# Patient Record
Sex: Male | Born: 1985 | Race: White | Hispanic: No | Marital: Married | Smoking: Never smoker
Health system: Southern US, Community
[De-identification: ages and names within clinical notes are randomized; demographics above are authoritative.]

## PROBLEM LIST (undated history)

## (undated) DIAGNOSIS — E079 Disorder of thyroid, unspecified: Secondary | ICD-10-CM

## (undated) HISTORY — PX: TONSILLECTOMY: SUR1361

## (undated) HISTORY — DX: Disorder of thyroid, unspecified: E07.9

---

## 2015-10-21 DIAGNOSIS — R51 Headache: Secondary | ICD-10-CM | POA: Diagnosis not present

## 2015-11-04 DIAGNOSIS — Z0000 Encounter for general adult medical examination without abnormal findings: Secondary | ICD-10-CM | POA: Diagnosis not present

## 2015-11-05 DIAGNOSIS — G44219 Episodic tension-type headache, not intractable: Secondary | ICD-10-CM | POA: Diagnosis not present

## 2015-11-05 DIAGNOSIS — Z049 Encounter for examination and observation for unspecified reason: Secondary | ICD-10-CM | POA: Diagnosis not present

## 2015-11-19 DIAGNOSIS — R1013 Epigastric pain: Secondary | ICD-10-CM | POA: Diagnosis not present

## 2015-11-19 DIAGNOSIS — R110 Nausea: Secondary | ICD-10-CM | POA: Diagnosis not present

## 2015-12-31 DIAGNOSIS — R1013 Epigastric pain: Secondary | ICD-10-CM | POA: Diagnosis not present

## 2016-01-01 DIAGNOSIS — G44219 Episodic tension-type headache, not intractable: Secondary | ICD-10-CM | POA: Diagnosis not present

## 2016-01-29 DIAGNOSIS — R1013 Epigastric pain: Secondary | ICD-10-CM | POA: Diagnosis not present

## 2016-03-18 DIAGNOSIS — L255 Unspecified contact dermatitis due to plants, except food: Secondary | ICD-10-CM | POA: Diagnosis not present

## 2016-03-18 DIAGNOSIS — H1033 Unspecified acute conjunctivitis, bilateral: Secondary | ICD-10-CM | POA: Diagnosis not present

## 2016-04-07 DIAGNOSIS — K2970 Gastritis, unspecified, without bleeding: Secondary | ICD-10-CM | POA: Diagnosis not present

## 2016-04-07 DIAGNOSIS — E039 Hypothyroidism, unspecified: Secondary | ICD-10-CM | POA: Diagnosis not present

## 2016-04-07 DIAGNOSIS — K2900 Acute gastritis without bleeding: Secondary | ICD-10-CM | POA: Diagnosis not present

## 2016-04-07 DIAGNOSIS — Z79899 Other long term (current) drug therapy: Secondary | ICD-10-CM | POA: Diagnosis not present

## 2016-04-07 DIAGNOSIS — R1013 Epigastric pain: Secondary | ICD-10-CM | POA: Diagnosis not present

## 2016-07-19 DIAGNOSIS — R251 Tremor, unspecified: Secondary | ICD-10-CM | POA: Diagnosis not present

## 2016-07-19 DIAGNOSIS — N509 Disorder of male genital organs, unspecified: Secondary | ICD-10-CM | POA: Diagnosis not present

## 2016-07-19 DIAGNOSIS — E039 Hypothyroidism, unspecified: Secondary | ICD-10-CM | POA: Diagnosis not present

## 2016-07-26 DIAGNOSIS — J029 Acute pharyngitis, unspecified: Secondary | ICD-10-CM | POA: Diagnosis not present

## 2016-07-26 NOTE — Progress Notes (Signed)
Subjective:   Trevor Castro was seen in consultation in the movement disorder clinic at the request of Konrad Felix, MD.  The evaluation is for tremor.  Tremor started approximately a few weeks ago and involves the bilateral hands.  Pt states "I never had steady hands but my wife noticed it."  Tremor is most noticeable when his arms are outstretched and "I worry about things so I have noticed it."   He noted when his family held their hands out they have a little tremor as well  Affected by caffeine:  Doesn't drink any Affected by alcohol:  Doesn't drink any Affected by stress:  Yes.   Affected by fatigue:  No. Spills soup if on spoon:  No. Spills glass of liquid if full:  No. Affects ADL's (tying shoes, brushing teeth, etc):  No.  Current/Previously tried tremor medications: n/a  Current medications that may exacerbate tremor:  On synthroid (dosage has been stable for a long time)  Outside reports reviewed: historical medical records, lab reports and office notes.  No Known Allergies  Outpatient Encounter Prescriptions as of 07/27/2016  Medication Sig  . levothyroxine (SYNTHROID, LEVOTHROID) 50 MCG tablet Take 50 mcg by mouth daily.  Marland Kitchen triamcinolone (NASACORT ALLERGY 24HR) 55 MCG/ACT AERO nasal inhaler Place 2 sprays into the nose daily.   No facility-administered encounter medications on file as of 07/27/2016.     Past Medical History:  Diagnosis Date  . Thyroid disease     Past Surgical History:  Procedure Laterality Date  . TONSILLECTOMY      Social History   Social History  . Marital status: Unknown    Spouse name: N/A  . Number of children: N/A  . Years of education: N/A   Occupational History  . [employer]    Social History Main Topics  . Smoking status: Never Smoker  . Smokeless tobacco: Never Used  . Alcohol use No  . Drug use: No  . Sexual activity: Not on file   Other Topics Concern  . Not on file   Social History Narrative    . No narrative on file    Family Status  Relation Status  . Mother Alive  . Father Alive  . Sister Alive  . Brother Alive  . Sister Alive  . Daughter Alive    Review of Systems A complete 10 system ROS was obtained and was negative apart from what is mentioned.   Objective:   VITALS:   Vitals:   07/27/16 1346  BP: 120/72  Pulse: 96  Weight: 146 lb (66.2 kg)  Height: 5\' 6"  (1.676 m)   Gen:  Appears stated age and in NAD.  Is anxious.   HEENT:  Normocephalic, atraumatic. The mucous membranes are moist. The superficial temporal arteries are without ropiness or tenderness. Cardiovascular: Regular rate and rhythm. Lungs: Clear to auscultation bilaterally. Neck: There are no carotid bruits noted bilaterally.  NEUROLOGICAL:  Orientation:  The patient is alert and oriented x 3.  Recent and remote memory are intact.  Attention span and concentration are normal.  Able to name objects and repeat without trouble.  Fund of knowledge is appropriate Cranial nerves: There is good facial symmetry. The pupils are equal round and reactive to light bilaterally. Fundoscopic exam reveals clear disc margins bilaterally. Extraocular muscles are intact and visual fields are full to confrontational testing. Speech is fluent and clear. Soft palate rises symmetrically and there is no tongue deviation. Hearing is intact to  conversational tone. Tone: Tone is good throughout. Sensation: Sensation is intact to light touch and pinprick throughout (facial, trunk, extremities). Vibration is intact at the bilateral big toe. There is no extinction with double simultaneous stimulation. There is no sensory dermatomal level identified. Coordination:  The patient has no dysdiadichokinesia or dysmetria. Motor: Strength is 5/5 in the bilateral upper and lower extremities.  Shoulder shrug is equal bilaterally.  There is no pronator drift.  There are no fasciculations noted. DTR's: Deep tendon reflexes are 2+/4 at the  bilateral biceps, triceps, brachioradialis, 3/4 at the bilateral patella and 2/4 at the bilateral achilles.  Plantar responses are downgoing bilaterally. Gait and Station: The patient is able to ambulate without difficulty. The patient is able to heel toe walk without any difficulty. The patient is able to ambulate in a tandem fashion. The patient is able to stand in the Romberg position.   MOVEMENT EXAM: Tremor:  There is minimal tremor of the outstretched hands that is noted equally with intention.  No rest tremor.  Has minimal tremor with archimedes spirals.  Tremor is just slightly evident when asked to pour water from one glass to another.   LABS:  The patient had lab work on 07/19/16 his white blood cells were 9.5, hemoglobin 14.6, hematocrit 42.8 and platelets 192.  Sodium was 138, potassium 4.6, chloride 97, CO2 21, AST 19, ALT 16, alkaline phosphatase 76, TSH 1.120.     Assessment/Plan:   1.  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  I explained to him nature and pathophysiology and discussed with him benign nature.  I did not recommend treatment for the tremor but do think that treatment for the anxiety could help significantly.  I did tell him that if he had a work presentation, etc then prn tx for tremor could be of value but otherwise wouldn't recommend tx.  It really isn't bothersome to the patient, he was just worried about it.  He will let me know if new neuro sx's arise.  Much greater than 50% of this visit was spent in counseling and coordinating care.  Total face to face time:  45 min   CC:  Konrad FelixBrad Thomas, MD

## 2016-07-27 ENCOUNTER — Encounter: Payer: Self-pay | Admitting: Neurology

## 2016-07-27 ENCOUNTER — Ambulatory Visit (INDEPENDENT_AMBULATORY_CARE_PROVIDER_SITE_OTHER): Payer: BLUE CROSS/BLUE SHIELD | Admitting: Neurology

## 2016-07-27 VITALS — BP 120/72 | HR 96 | Ht 66.0 in | Wt 146.0 lb

## 2016-07-27 DIAGNOSIS — R251 Tremor, unspecified: Secondary | ICD-10-CM | POA: Diagnosis not present

## 2016-07-27 DIAGNOSIS — F411 Generalized anxiety disorder: Secondary | ICD-10-CM | POA: Diagnosis not present

## 2016-07-27 DIAGNOSIS — H5203 Hypermetropia, bilateral: Secondary | ICD-10-CM | POA: Diagnosis not present

## 2016-08-18 DIAGNOSIS — N419 Inflammatory disease of prostate, unspecified: Secondary | ICD-10-CM | POA: Diagnosis not present

## 2016-08-18 DIAGNOSIS — N3000 Acute cystitis without hematuria: Secondary | ICD-10-CM | POA: Diagnosis not present

## 2016-08-29 DIAGNOSIS — M940 Chondrocostal junction syndrome [Tietze]: Secondary | ICD-10-CM | POA: Diagnosis not present

## 2016-08-29 DIAGNOSIS — R079 Chest pain, unspecified: Secondary | ICD-10-CM | POA: Diagnosis not present

## 2016-08-29 DIAGNOSIS — R072 Precordial pain: Secondary | ICD-10-CM | POA: Diagnosis not present

## 2016-08-30 DIAGNOSIS — N410 Acute prostatitis: Secondary | ICD-10-CM | POA: Diagnosis not present

## 2016-09-22 DIAGNOSIS — N410 Acute prostatitis: Secondary | ICD-10-CM | POA: Diagnosis not present

## 2016-11-16 DIAGNOSIS — Z1211 Encounter for screening for malignant neoplasm of colon: Secondary | ICD-10-CM | POA: Diagnosis not present

## 2016-11-16 DIAGNOSIS — Z86010 Personal history of colonic polyps: Secondary | ICD-10-CM | POA: Diagnosis not present

## 2016-11-16 DIAGNOSIS — Z01818 Encounter for other preprocedural examination: Secondary | ICD-10-CM | POA: Diagnosis not present

## 2016-11-18 DIAGNOSIS — J0100 Acute maxillary sinusitis, unspecified: Secondary | ICD-10-CM | POA: Diagnosis not present

## 2016-12-15 DIAGNOSIS — Z79899 Other long term (current) drug therapy: Secondary | ICD-10-CM | POA: Diagnosis not present

## 2016-12-15 DIAGNOSIS — K601 Chronic anal fissure: Secondary | ICD-10-CM | POA: Diagnosis not present

## 2016-12-15 DIAGNOSIS — Z86010 Personal history of colonic polyps: Secondary | ICD-10-CM | POA: Diagnosis not present

## 2016-12-15 DIAGNOSIS — Z1211 Encounter for screening for malignant neoplasm of colon: Secondary | ICD-10-CM | POA: Diagnosis not present

## 2016-12-15 DIAGNOSIS — K644 Residual hemorrhoidal skin tags: Secondary | ICD-10-CM | POA: Diagnosis not present

## 2016-12-15 DIAGNOSIS — E039 Hypothyroidism, unspecified: Secondary | ICD-10-CM | POA: Diagnosis not present

## 2017-01-03 DIAGNOSIS — E039 Hypothyroidism, unspecified: Secondary | ICD-10-CM | POA: Diagnosis not present

## 2017-01-03 DIAGNOSIS — R102 Pelvic and perineal pain: Secondary | ICD-10-CM | POA: Diagnosis not present

## 2017-01-11 DIAGNOSIS — L255 Unspecified contact dermatitis due to plants, except food: Secondary | ICD-10-CM | POA: Diagnosis not present

## 2017-02-08 DIAGNOSIS — E039 Hypothyroidism, unspecified: Secondary | ICD-10-CM | POA: Diagnosis not present

## 2017-02-08 DIAGNOSIS — Z79899 Other long term (current) drug therapy: Secondary | ICD-10-CM | POA: Diagnosis not present

## 2017-02-08 DIAGNOSIS — Z0000 Encounter for general adult medical examination without abnormal findings: Secondary | ICD-10-CM | POA: Diagnosis not present

## 2017-02-08 DIAGNOSIS — Z1389 Encounter for screening for other disorder: Secondary | ICD-10-CM | POA: Diagnosis not present

## 2017-03-18 DIAGNOSIS — R3915 Urgency of urination: Secondary | ICD-10-CM | POA: Diagnosis not present

## 2017-03-18 DIAGNOSIS — N411 Chronic prostatitis: Secondary | ICD-10-CM | POA: Diagnosis not present

## 2017-03-18 DIAGNOSIS — R350 Frequency of micturition: Secondary | ICD-10-CM | POA: Diagnosis not present

## 2017-03-24 DIAGNOSIS — K529 Noninfective gastroenteritis and colitis, unspecified: Secondary | ICD-10-CM | POA: Diagnosis not present

## 2017-05-17 DIAGNOSIS — N411 Chronic prostatitis: Secondary | ICD-10-CM | POA: Diagnosis not present

## 2017-06-22 DIAGNOSIS — J069 Acute upper respiratory infection, unspecified: Secondary | ICD-10-CM | POA: Diagnosis not present

## 2017-07-14 ENCOUNTER — Other Ambulatory Visit: Payer: Self-pay | Admitting: Urology

## 2017-07-14 DIAGNOSIS — R350 Frequency of micturition: Secondary | ICD-10-CM | POA: Diagnosis not present

## 2017-07-14 DIAGNOSIS — R1033 Periumbilical pain: Secondary | ICD-10-CM | POA: Diagnosis not present

## 2017-07-20 ENCOUNTER — Ambulatory Visit (HOSPITAL_COMMUNITY)
Admission: RE | Admit: 2017-07-20 | Discharge: 2017-07-20 | Disposition: A | Discharge status: Home / Self Care | Payer: BLUE CROSS/BLUE SHIELD | Source: Ambulatory Visit | Attending: Urology | Admitting: Urology

## 2017-07-20 ENCOUNTER — Other Ambulatory Visit: Payer: Self-pay | Admitting: Urology

## 2017-07-20 DIAGNOSIS — R39198 Other difficulties with micturition: Secondary | ICD-10-CM | POA: Insufficient documentation

## 2017-07-20 DIAGNOSIS — R1033 Periumbilical pain: Secondary | ICD-10-CM | POA: Diagnosis not present

## 2017-07-25 DIAGNOSIS — J029 Acute pharyngitis, unspecified: Secondary | ICD-10-CM | POA: Diagnosis not present

## 2017-10-17 DIAGNOSIS — E039 Hypothyroidism, unspecified: Secondary | ICD-10-CM | POA: Diagnosis not present

## 2017-11-11 DIAGNOSIS — K529 Noninfective gastroenteritis and colitis, unspecified: Secondary | ICD-10-CM | POA: Diagnosis not present

## 2017-12-24 DIAGNOSIS — J029 Acute pharyngitis, unspecified: Secondary | ICD-10-CM | POA: Diagnosis not present

## 2017-12-24 DIAGNOSIS — J069 Acute upper respiratory infection, unspecified: Secondary | ICD-10-CM | POA: Diagnosis not present

## 2017-12-24 DIAGNOSIS — Z7251 High risk heterosexual behavior: Secondary | ICD-10-CM | POA: Diagnosis not present

## 2018-02-27 IMAGING — US US RENAL
1 series · 14 of 25 positions shown · non-contrast
Comparison: None

CLINICAL DATA: Periumbilical abdominal pain

EXAM:
RENAL / URINARY TRACT ULTRASOUND COMPLETE

[Series 1: us renal · 0.23mm/px · 14 of 59 slices shown]
[im 1/59]
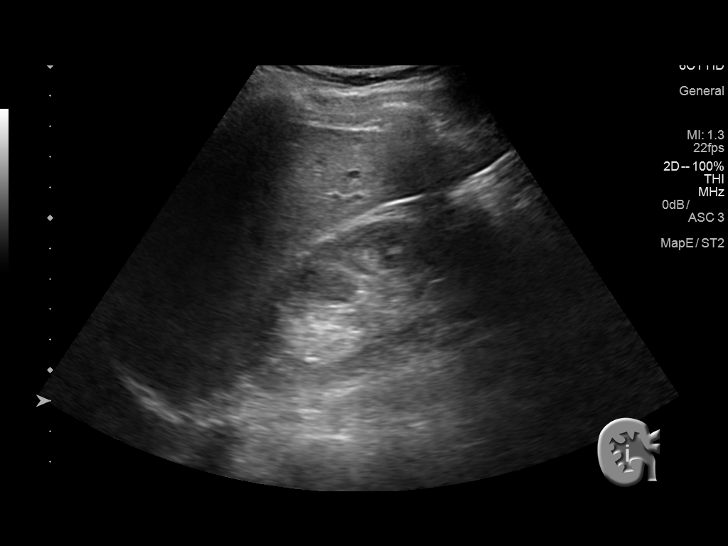
[im 5/59]
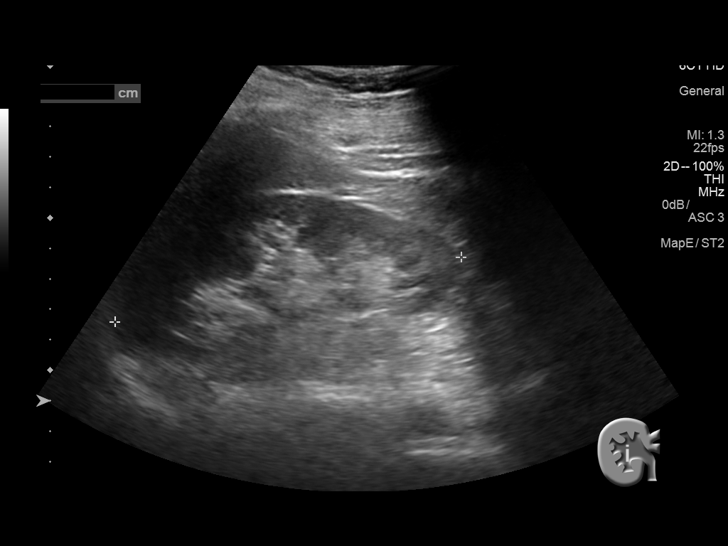
[im 10/59]
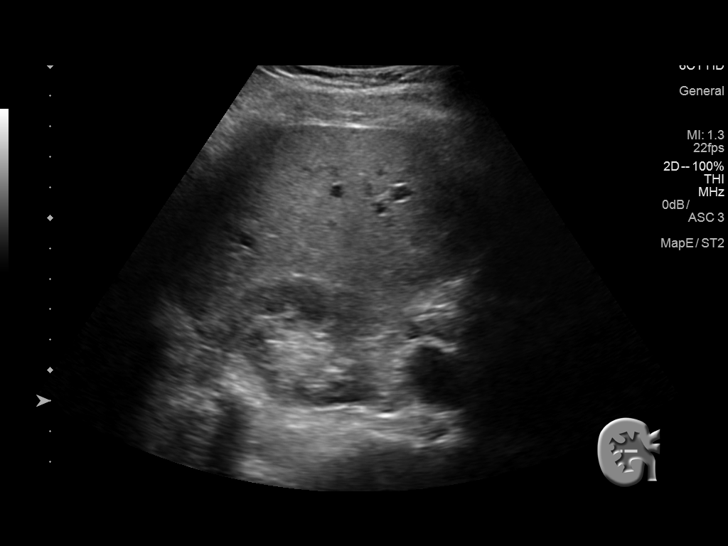
[im 15/59]
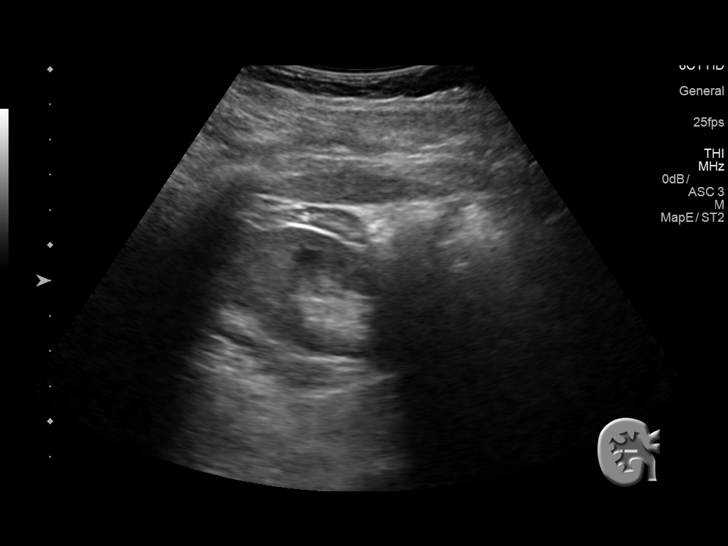
[im 20/59]
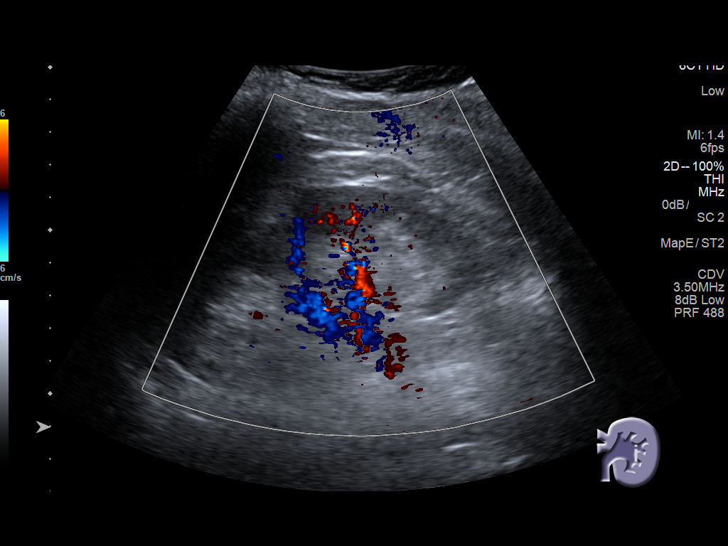
[im 22/59]
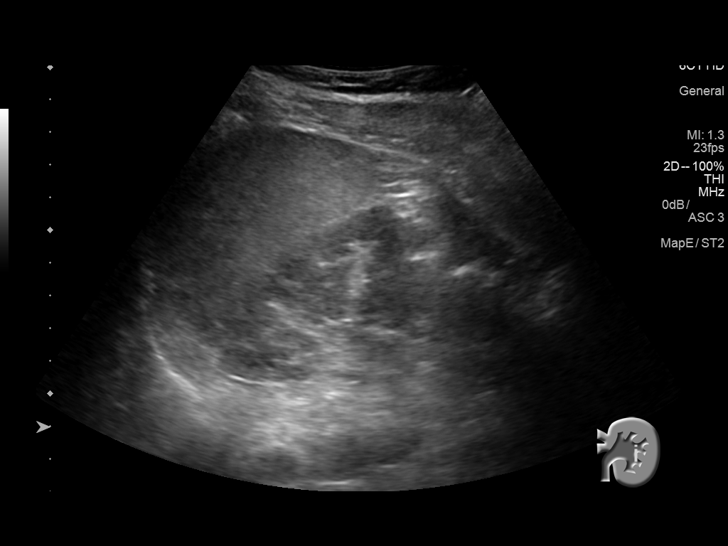
[im 27/59]
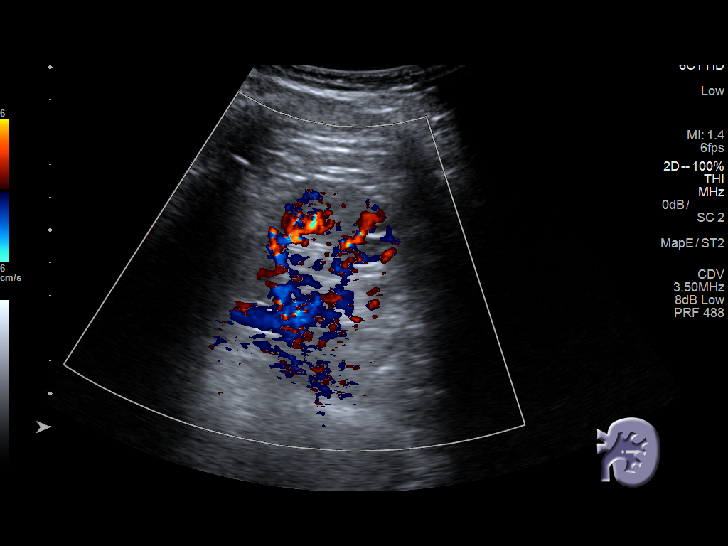
[im 32/59]
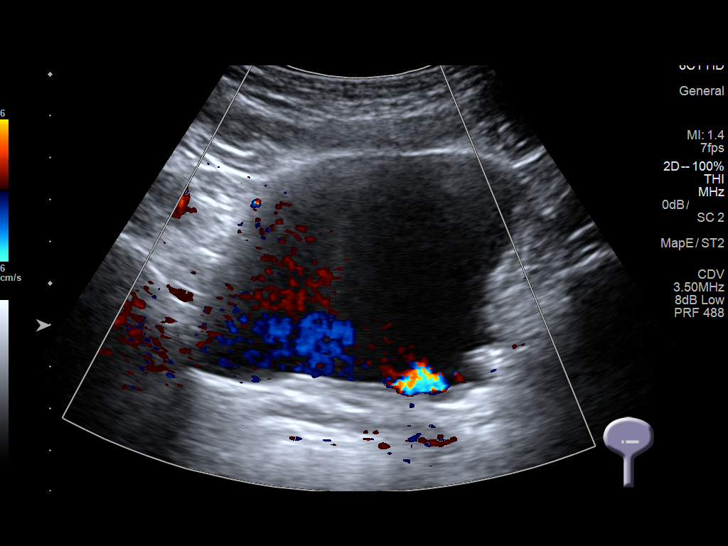
[im 37/59]
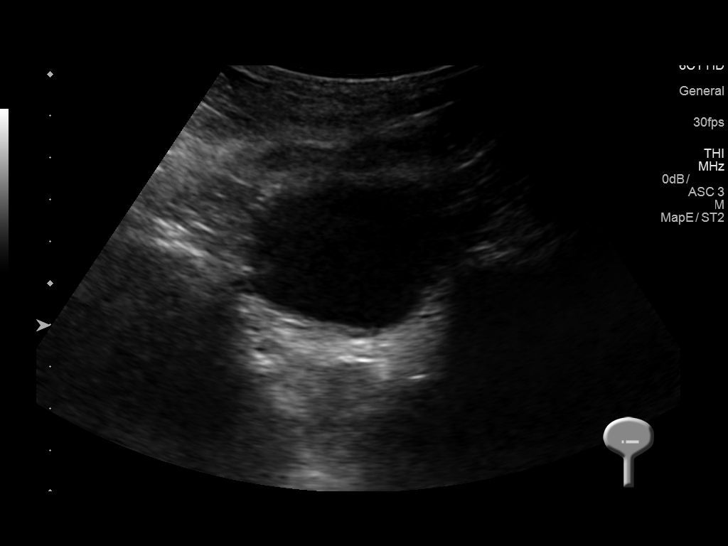
[im 39/59]
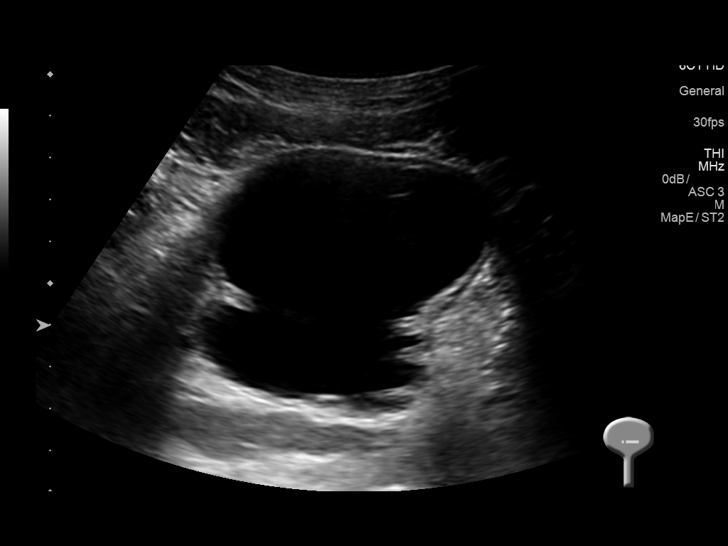
[im 44/59]
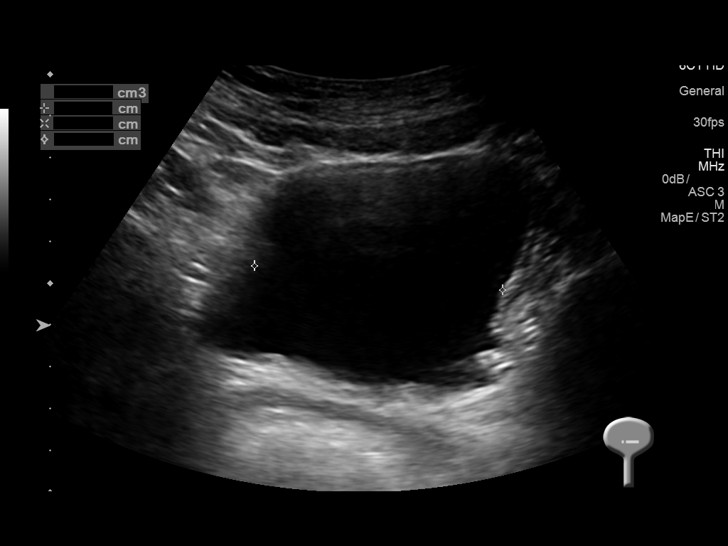
[im 49/59]
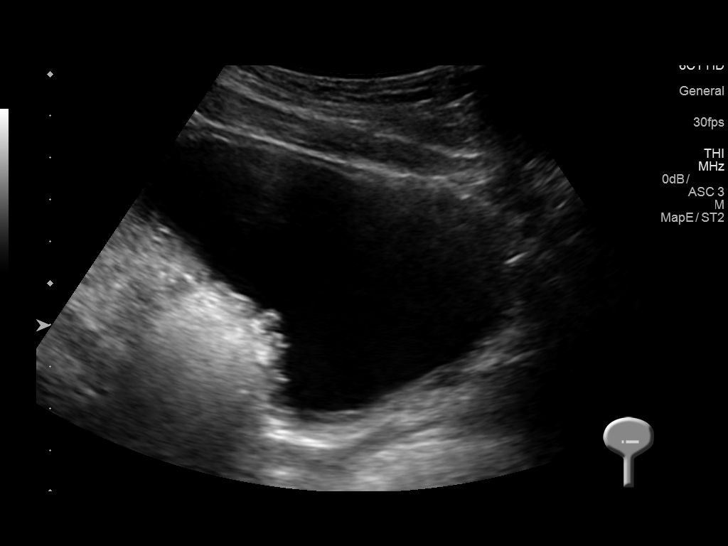
[im 54/59]
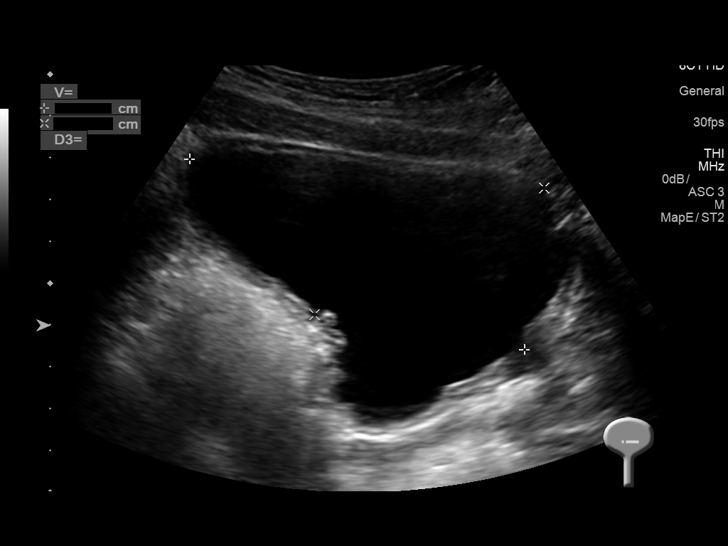
[im 59/59]
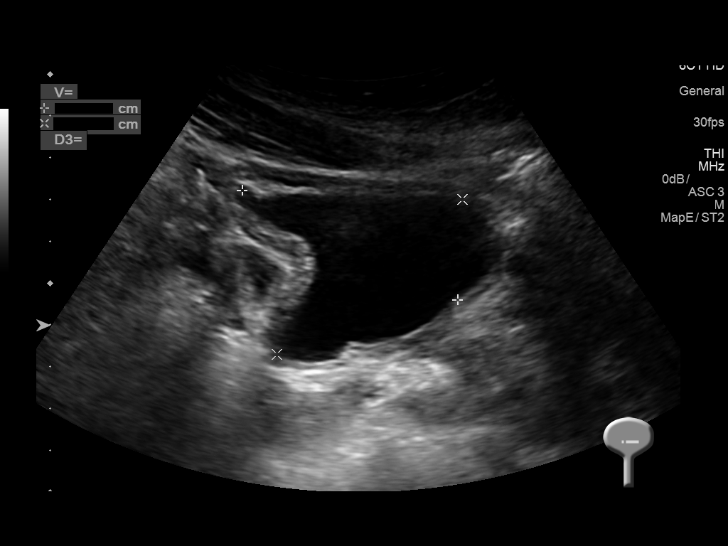

[14 of 25 positions shown; findings below may reference images not displayed]

FINDINGS: Right Kidney:

Length: 11.6 cm. Echogenicity within normal limits. No mass or
hydronephrosis visualized.

Left Kidney:

Length: 11.5 cm. Echogenicity within normal limits. No mass or
hydronephrosis visualized.

Bladder:

Appears normal for degree of bladder distention. Moderate postvoid
residual of 86 mL.
IMPRESSION: No hydronephrosis.

Moderate postvoid residual in the bladder.

## 2018-06-19 DIAGNOSIS — J029 Acute pharyngitis, unspecified: Secondary | ICD-10-CM | POA: Diagnosis not present

## 2018-08-22 DIAGNOSIS — E039 Hypothyroidism, unspecified: Secondary | ICD-10-CM | POA: Diagnosis not present

## 2019-02-19 DIAGNOSIS — Z6824 Body mass index (BMI) 24.0-24.9, adult: Secondary | ICD-10-CM | POA: Diagnosis not present

## 2019-02-19 DIAGNOSIS — Z0000 Encounter for general adult medical examination without abnormal findings: Secondary | ICD-10-CM | POA: Diagnosis not present

## 2019-02-19 DIAGNOSIS — Z1331 Encounter for screening for depression: Secondary | ICD-10-CM | POA: Diagnosis not present

## 2019-04-30 DIAGNOSIS — J3489 Other specified disorders of nose and nasal sinuses: Secondary | ICD-10-CM | POA: Diagnosis not present

## 2019-04-30 DIAGNOSIS — Z20828 Contact with and (suspected) exposure to other viral communicable diseases: Secondary | ICD-10-CM | POA: Diagnosis not present

## 2019-06-08 DIAGNOSIS — H1033 Unspecified acute conjunctivitis, bilateral: Secondary | ICD-10-CM | POA: Diagnosis not present

## 2019-07-31 DIAGNOSIS — B309 Viral conjunctivitis, unspecified: Secondary | ICD-10-CM | POA: Diagnosis not present

## 2019-08-01 DIAGNOSIS — H1013 Acute atopic conjunctivitis, bilateral: Secondary | ICD-10-CM | POA: Diagnosis not present

## 2019-08-20 DIAGNOSIS — J302 Other seasonal allergic rhinitis: Secondary | ICD-10-CM | POA: Diagnosis not present

## 2019-08-20 DIAGNOSIS — E039 Hypothyroidism, unspecified: Secondary | ICD-10-CM | POA: Diagnosis not present

## 2019-08-27 DIAGNOSIS — R519 Headache, unspecified: Secondary | ICD-10-CM | POA: Diagnosis not present

## 2019-08-27 DIAGNOSIS — R509 Fever, unspecified: Secondary | ICD-10-CM | POA: Diagnosis not present

## 2019-08-27 DIAGNOSIS — Z20828 Contact with and (suspected) exposure to other viral communicable diseases: Secondary | ICD-10-CM | POA: Diagnosis not present

## 2019-08-27 DIAGNOSIS — R05 Cough: Secondary | ICD-10-CM | POA: Diagnosis not present

## 2019-09-12 DIAGNOSIS — H1013 Acute atopic conjunctivitis, bilateral: Secondary | ICD-10-CM | POA: Diagnosis not present

## 2020-03-17 DIAGNOSIS — Z0000 Encounter for general adult medical examination without abnormal findings: Secondary | ICD-10-CM | POA: Diagnosis not present

## 2020-03-17 DIAGNOSIS — Z131 Encounter for screening for diabetes mellitus: Secondary | ICD-10-CM | POA: Diagnosis not present

## 2020-03-17 DIAGNOSIS — Z1331 Encounter for screening for depression: Secondary | ICD-10-CM | POA: Diagnosis not present

## 2020-03-17 DIAGNOSIS — Z13220 Encounter for screening for lipoid disorders: Secondary | ICD-10-CM | POA: Diagnosis not present

## 2020-03-17 DIAGNOSIS — Z23 Encounter for immunization: Secondary | ICD-10-CM | POA: Diagnosis not present

## 2020-06-05 DIAGNOSIS — E039 Hypothyroidism, unspecified: Secondary | ICD-10-CM | POA: Diagnosis not present

## 2020-06-05 DIAGNOSIS — E785 Hyperlipidemia, unspecified: Secondary | ICD-10-CM | POA: Diagnosis not present

## 2020-06-05 DIAGNOSIS — F411 Generalized anxiety disorder: Secondary | ICD-10-CM | POA: Diagnosis not present

## 2020-06-05 DIAGNOSIS — J302 Other seasonal allergic rhinitis: Secondary | ICD-10-CM | POA: Diagnosis not present

## 2020-06-13 DIAGNOSIS — R110 Nausea: Secondary | ICD-10-CM | POA: Diagnosis not present

## 2020-06-13 DIAGNOSIS — K591 Functional diarrhea: Secondary | ICD-10-CM | POA: Diagnosis not present

## 2020-07-31 ENCOUNTER — Encounter: Payer: Self-pay | Admitting: Allergy and Immunology

## 2020-07-31 ENCOUNTER — Ambulatory Visit (INDEPENDENT_AMBULATORY_CARE_PROVIDER_SITE_OTHER): Payer: BC Managed Care – PPO | Admitting: Allergy and Immunology

## 2020-07-31 ENCOUNTER — Other Ambulatory Visit: Payer: Self-pay

## 2020-07-31 VITALS — BP 118/62 | HR 84 | Resp 16 | Ht 67.0 in | Wt 151.4 lb

## 2020-07-31 DIAGNOSIS — H1044 Vernal conjunctivitis: Secondary | ICD-10-CM | POA: Diagnosis not present

## 2020-07-31 DIAGNOSIS — H1010 Acute atopic conjunctivitis, unspecified eye: Secondary | ICD-10-CM

## 2020-07-31 DIAGNOSIS — H1013 Acute atopic conjunctivitis, bilateral: Secondary | ICD-10-CM | POA: Diagnosis not present

## 2020-07-31 MED ORDER — ALREX 0.2 % OP SUSP: OPHTHALMIC | 5 refills | Status: AC

## 2020-07-31 NOTE — Progress Notes (Signed)
[place] - [place] - [place]   Dear Dr. Tomasa Blase,  Thank you for referring Trevor Castro to the [facility] [place] on 07/31/2020.   Below is a summation of this patient's evaluation and recommendations.  Thank you for your referral. I will keep you informed about this patient's response to treatment.   If you have any questions please do not hesitate to contact me.   Sincerely,  Jessica Priest, MD Allergy / Immunology [facility]   ______________________________________________________________________    NEW PATIENT NOTE  Referring Provider: Paulina Fusi, MD Primary Provider: Paulina Fusi, MD Date of office visit: 07/31/2020    Subjective:   Chief Complaint:  Trevor Castro (DOB: Jul 04, 1985) is a 34 y.o. male who presents to the clinic on 07/31/2020 with a chief complaint of Allergic Rhinitis  .     HPI: Trevor Castro presents to this clinic in evaluation of eye allergies.  Apparently he has developed very significant eye allergies manifested as dry itchy red gritty eyes that presents itself from September through March of the year for the past 2 years.  He has been using a collection of Claritin-D and eyedrops which do help.  But he still has some symptoms.  He had montelukast introduced recently which did not help him at all.  He does not really note any obvious provoking factor giving rise to his eye issue.  Maybe locating to the outdoors in the woods may precipitate this issue but there doesn't really appear to be a prevalent trigger.  He has had examination of his eyes by Dr. Gavin Potters and was told that his eye issue is allergic in nature.  He does not have any other associated atopic symptoms.  He has minimal nasal symptoms.  He does not have a history of asthma or atopic dermatitis.  He has received 3 Moderna Covid vaccines and a flu  vaccine.  Past Medical History:  Diagnosis Date  . Thyroid disease     Past Surgical History:  Procedure Laterality Date  . TONSILLECTOMY      Allergies as of 07/31/2020   No Known Allergies     Medication List    Bepotastine Besilate 1.5 % Soln   escitalopram 10 MG tablet Commonly known as: LEXAPRO Take 10 mg by mouth daily.   levothyroxine 50 MCG tablet Commonly known as: SYNTHROID Take 50 mcg by mouth daily.   montelukast 10 MG tablet Commonly known as: SINGULAIR Take 10 mg by mouth daily.   rosuvastatin 5 MG tablet Commonly known as: CRESTOR Take 5 mg by mouth daily.       Review of systems negative except as noted in HPI / PMHx or noted below:  Review of Systems  Constitutional: Negative.   HENT: Negative.   Eyes: Negative.   Respiratory: Negative.   Cardiovascular: Negative.   Gastrointestinal: Negative.   Genitourinary: Negative.   Musculoskeletal: Negative.   Skin: Negative.   Neurological: Negative.   Endo/Heme/Allergies: Negative.   Psychiatric/Behavioral: Negative.     Family History  Problem Relation Age of Onset  . Heart attack Father   . Eczema Daughter     Social History   Socioeconomic History  . Marital status: Married    Spouse name: Not on file  . Number of children: Not on file  . Years of education: Not on file  . Highest education level: Not on file  Occupational History  . Occupation: [employer]  Tobacco Use  . Smoking status: Never Smoker  . Smokeless tobacco: Never Used  Substance and Sexual Activity  . Alcohol use: No  . Drug use: No  . Sexual activity: Not on file  Other Topics Concern  . Not on file  Social History Narrative  . Not on file   Environmental and Social history  Lives in a house with a dry environment, dog located inside the household, carpet in the bedroom, no plastic on the bed, no plastic on the pillow, no smoking ongoing with inside the household.  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  Objective:   Vitals:   07/31/20 0942  BP: 118/62  Pulse: 84  Resp: 16  SpO2: 98%   Height: 5\' 7"  (170.2 cm) Weight: 151 lb 6.4 oz (68.7 kg)  Physical Exam Constitutional:      Appearance: He is not diaphoretic.  HENT:     Head: Normocephalic. No right periorbital erythema or left periorbital erythema.     Right Ear: Tympanic membrane, ear canal and external ear normal.     Left Ear: Tympanic membrane, ear canal and external ear normal.     Nose: Nose normal. No mucosal edema or rhinorrhea.     Mouth/Throat:     Mouth: Oropharynx is clear and moist and mucous membranes are normal.     Pharynx: Uvula midline. No oropharyngeal exudate.  Eyes:     General: Lids are normal.     Conjunctiva/sclera: Conjunctivae normal.     Pupils: Pupils are equal, round, and reactive to light.  Neck:     Thyroid: No thyromegaly.     Trachea: Trachea normal. No tracheal tenderness or tracheal deviation.  Cardiovascular:     Rate and Rhythm: Normal rate and regular rhythm.     Heart sounds: Normal heart sounds, S1 normal and S2 normal. No murmur heard.   Pulmonary:     Effort: Pulmonary effort is normal. No respiratory distress.     Breath sounds: Normal breath sounds. No stridor. No wheezing or rales.  Chest:     Chest wall: No tenderness.  Abdominal:     General: There is no distension.     Palpations: Abdomen is soft. There is no hepatosplenomegaly or mass.     Tenderness: There is no abdominal tenderness. There is no guarding or rebound.  Musculoskeletal:        General: No tenderness or edema.  Lymphadenopathy:     Head:     Right side of head: No tonsillar adenopathy.     Left side of head: No tonsillar adenopathy.     Cervical: No cervical adenopathy.     Upper Body:  No axillary adenopathy present. Skin:    Coloration: Skin is not pale.     Findings: No erythema or rash.     Nails: There is no clubbing.  Neurological:     Mental Status: He is alert.      Diagnostics: Allergy skin tests were performed.  He demonstrated hypersensitivity to mold and house dust mite  Assessment and Plan:    1. Seasonal allergic conjunctivitis   2. Perennial allergic conjunctivitis of both eyes   3. Vernal conjunctivitis of both eyes     1.  Allergen avoidance measures - mold / dust mite  2.  Continue Bepotastine -1 drop each eye 1-2 times a day  3.  Continue OTC Claritin-D 24 -1 tablet 1  time per day  4.  If needed:   A. Systane gel drop before bed  B. Systane drops multiple times per day  C. Alrex -1 drop each eye 1 time per day during flareup  5. Immunotherapy?  6. Contact clinic with response.  Jaidyn has inflammation of his conjunctiva.  He may have a component of atopic disease contributing to this issue and he may also have vernal conjunctivitis.  We will treat him with the therapy noted above which includes allergen avoidance measures and the continued use of topical antihistamines and oral antihistamines.  I recommended that he use a lubricating drop when needed and he can use a low potency topical steroid for his conjunctival inflammation during flareup.  He would be a candidate for immunotherapy if he fails this form of treatment.  He will keep in contact with [place] noting his response to this approach.  Jessica Priest, MD Allergy / Immunology [facility] [place]

## 2020-07-31 NOTE — Patient Instructions (Addendum)
  1.  Allergen avoidance measures - mold / dust mite  2.  Continue Bepotastine -1 drop each eye 1-2 times a day  3.  Continue OTC Claritin-D 24 -1 tablet 1 time per day  4.  If needed:   A. Systane gel drop before bed  B. Systane drops multiple times per day  C. Alrex -1 drop each eye 1 time per day during flareup  5. Immunotherapy?  6. Contact clinic with response.

## 2020-08-01 ENCOUNTER — Encounter: Payer: Self-pay | Admitting: Allergy and Immunology

## 2020-10-08 DIAGNOSIS — R112 Nausea with vomiting, unspecified: Secondary | ICD-10-CM | POA: Diagnosis not present

## 2020-10-08 DIAGNOSIS — Z20822 Contact with and (suspected) exposure to covid-19: Secondary | ICD-10-CM | POA: Diagnosis not present

## 2020-10-08 DIAGNOSIS — K529 Noninfective gastroenteritis and colitis, unspecified: Secondary | ICD-10-CM | POA: Diagnosis not present

## 2020-10-14 DIAGNOSIS — J302 Other seasonal allergic rhinitis: Secondary | ICD-10-CM | POA: Diagnosis not present

## 2020-10-14 DIAGNOSIS — E039 Hypothyroidism, unspecified: Secondary | ICD-10-CM | POA: Diagnosis not present

## 2020-10-14 DIAGNOSIS — E785 Hyperlipidemia, unspecified: Secondary | ICD-10-CM | POA: Diagnosis not present

## 2020-10-14 DIAGNOSIS — F411 Generalized anxiety disorder: Secondary | ICD-10-CM | POA: Diagnosis not present

## 2020-12-25 DIAGNOSIS — E039 Hypothyroidism, unspecified: Secondary | ICD-10-CM | POA: Diagnosis not present

## 2020-12-25 DIAGNOSIS — E785 Hyperlipidemia, unspecified: Secondary | ICD-10-CM | POA: Diagnosis not present

## 2020-12-25 DIAGNOSIS — F411 Generalized anxiety disorder: Secondary | ICD-10-CM | POA: Diagnosis not present

## 2021-06-12 DIAGNOSIS — B349 Viral infection, unspecified: Secondary | ICD-10-CM | POA: Diagnosis not present

## 2021-06-12 DIAGNOSIS — R112 Nausea with vomiting, unspecified: Secondary | ICD-10-CM | POA: Diagnosis not present

## 2021-07-02 DIAGNOSIS — E785 Hyperlipidemia, unspecified: Secondary | ICD-10-CM | POA: Diagnosis not present

## 2021-07-02 DIAGNOSIS — Z6824 Body mass index (BMI) 24.0-24.9, adult: Secondary | ICD-10-CM | POA: Diagnosis not present

## 2021-07-02 DIAGNOSIS — Z0000 Encounter for general adult medical examination without abnormal findings: Secondary | ICD-10-CM | POA: Diagnosis not present

## 2022-01-12 DIAGNOSIS — E785 Hyperlipidemia, unspecified: Secondary | ICD-10-CM | POA: Diagnosis not present

## 2022-01-12 DIAGNOSIS — Z6824 Body mass index (BMI) 24.0-24.9, adult: Secondary | ICD-10-CM | POA: Diagnosis not present

## 2022-01-12 DIAGNOSIS — E039 Hypothyroidism, unspecified: Secondary | ICD-10-CM | POA: Diagnosis not present

## 2022-08-11 DIAGNOSIS — J324 Chronic pansinusitis: Secondary | ICD-10-CM | POA: Diagnosis not present

## 2022-08-11 DIAGNOSIS — R070 Pain in throat: Secondary | ICD-10-CM | POA: Diagnosis not present

## 2022-08-11 DIAGNOSIS — H1032 Unspecified acute conjunctivitis, left eye: Secondary | ICD-10-CM | POA: Diagnosis not present

## 2022-11-13 DIAGNOSIS — E039 Hypothyroidism, unspecified: Secondary | ICD-10-CM | POA: Diagnosis not present

## 2022-11-13 DIAGNOSIS — E785 Hyperlipidemia, unspecified: Secondary | ICD-10-CM | POA: Diagnosis not present

## 2022-11-13 DIAGNOSIS — F419 Anxiety disorder, unspecified: Secondary | ICD-10-CM | POA: Diagnosis not present

## 2022-11-13 DIAGNOSIS — Z6824 Body mass index (BMI) 24.0-24.9, adult: Secondary | ICD-10-CM | POA: Diagnosis not present

## 2023-05-31 DIAGNOSIS — E785 Hyperlipidemia, unspecified: Secondary | ICD-10-CM | POA: Diagnosis not present

## 2023-05-31 DIAGNOSIS — F419 Anxiety disorder, unspecified: Secondary | ICD-10-CM | POA: Diagnosis not present

## 2023-05-31 DIAGNOSIS — E039 Hypothyroidism, unspecified: Secondary | ICD-10-CM | POA: Diagnosis not present

## 2023-06-25 DIAGNOSIS — R509 Fever, unspecified: Secondary | ICD-10-CM | POA: Diagnosis not present

## 2023-06-25 DIAGNOSIS — R0981 Nasal congestion: Secondary | ICD-10-CM | POA: Diagnosis not present

## 2023-06-25 DIAGNOSIS — R059 Cough, unspecified: Secondary | ICD-10-CM | POA: Diagnosis not present

## 2023-06-25 DIAGNOSIS — R062 Wheezing: Secondary | ICD-10-CM | POA: Diagnosis not present

## 2024-02-01 DIAGNOSIS — M25561 Pain in right knee: Secondary | ICD-10-CM | POA: Diagnosis not present

## 2024-02-01 DIAGNOSIS — F419 Anxiety disorder, unspecified: Secondary | ICD-10-CM | POA: Diagnosis not present

## 2024-02-01 DIAGNOSIS — Z6826 Body mass index (BMI) 26.0-26.9, adult: Secondary | ICD-10-CM | POA: Diagnosis not present

## 2024-02-01 DIAGNOSIS — E785 Hyperlipidemia, unspecified: Secondary | ICD-10-CM | POA: Diagnosis not present

## 2024-02-01 DIAGNOSIS — E039 Hypothyroidism, unspecified: Secondary | ICD-10-CM | POA: Diagnosis not present
